# Patient Record
Sex: Male | Born: 2000 | Race: Black or African American | Hispanic: No | Marital: Single | Smoking: Never smoker
Health system: Southern US, Community
[De-identification: ages and names within clinical notes are randomized; demographics above are authoritative.]

---
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
## 2023-10-08 ENCOUNTER — Emergency Department (HOSPITAL_BASED_OUTPATIENT_CLINIC_OR_DEPARTMENT_OTHER)
Admission: EM | Admit: 2023-10-08 | Discharge: 2023-10-08 | Disposition: A | Discharge status: Home / Self Care | Attending: Emergency Medicine | Admitting: Emergency Medicine

## 2023-10-08 ENCOUNTER — Other Ambulatory Visit: Payer: Self-pay

## 2023-10-08 ENCOUNTER — Emergency Department (HOSPITAL_BASED_OUTPATIENT_CLINIC_OR_DEPARTMENT_OTHER)

## 2023-10-08 DIAGNOSIS — M542 Cervicalgia: Secondary | ICD-10-CM | POA: Diagnosis present

## 2023-10-08 DIAGNOSIS — Y92410 Unspecified street and highway as the place of occurrence of the external cause: Secondary | ICD-10-CM | POA: Insufficient documentation

## 2023-10-08 DIAGNOSIS — R519 Headache, unspecified: Secondary | ICD-10-CM | POA: Insufficient documentation

## 2023-10-08 MED ORDER — METHOCARBAMOL 500 MG PO TABS: 500.0000 mg | ORAL_TABLET | Freq: Two times a day (BID) | ORAL | 0 refills | Status: AC

## 2023-10-08 MED ORDER — NAPROXEN 375 MG PO TABS: 375.0000 mg | ORAL_TABLET | Freq: Two times a day (BID) | ORAL | 0 refills | Status: DC

## 2023-10-08 MED ORDER — OXYCODONE HCL 5 MG PO TABS
5.0000 mg | ORAL_TABLET | Freq: Once | ORAL | Status: AC
  Administered 2023-10-08: 5 mg via ORAL
  Filled 2023-10-08: qty 1

## 2023-10-08 NOTE — ED Notes (Signed)
 ED Provider at bedside.
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
## 2023-10-08 NOTE — ED Notes (Signed)
 Discharge condition documented in error.

## 2023-10-08 NOTE — ED Notes (Signed)
 Patient transported to CT
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
## 2023-10-08 NOTE — Discharge Instructions (Addendum)
I have prescribed muscle relaxers for your pain, please do not drink or drive while taking this medications as it can make you drowsy.     Please follow-up with PCP in 1 week for reevaluation of your symptoms.If you experience any bowel or bladder incontinence, fever, worsening in your symptoms please return to the ED.
